# Patient Record
Sex: Female | Born: 1995 | Race: White | Hispanic: No | Marital: Single
Health system: Midwestern US, Community
[De-identification: ages and names within clinical notes are randomized; demographics above are authoritative.]

## PROBLEM LIST (undated history)

## (undated) DIAGNOSIS — L709 Acne, unspecified: Secondary | ICD-10-CM

## (undated) HISTORY — PX: WISDOM TOOTH EXTRACTION: SHX21

## (undated) HISTORY — DX: Acne, unspecified: L70.9

---

## 2014-07-28 ENCOUNTER — Emergency Department: Payer: Self-pay | Admitting: Emergency Medicine

## 2014-07-28 LAB — BASIC METABOLIC PANEL
Anion Gap: 5 — ABNORMAL LOW (ref 7–16)
BUN: 8 mg/dL — ABNORMAL LOW (ref 9–21)
CHLORIDE: 109 mmol/L — AB (ref 97–107)
Calcium, Total: 8 mg/dL — ABNORMAL LOW (ref 9.0–10.7)
Co2: 27 mmol/L — ABNORMAL HIGH (ref 16–25)
Creatinine: 0.79 mg/dL (ref 0.60–1.30)
EGFR (African American): >60
EGFR (Non-African Amer.): >60
Glucose: 95 mg/dL (ref 65–99)
Osmolality: 279 (ref 275–301)
Potassium: 3.6 mmol/L (ref 3.3–4.7)
Sodium: 141 mmol/L (ref 132–141)

## 2014-07-28 LAB — CBC
HCT: 38.4 % (ref 35.0–47.0)
HGB: 12.7 g/dL (ref 12.0–16.0)
MCH: 30.3 pg (ref 26.0–34.0)
MCHC: 32.9 g/dL (ref 32.0–36.0)
MCV: 92 fL (ref 80–100)
Platelet: 272 10*3/uL (ref 150–440)
RBC: 4.17 10*6/uL (ref 3.80–5.20)
RDW: 13.4 % (ref 11.5–14.5)
WBC: 11.2 10*3/uL — ABNORMAL HIGH (ref 3.6–11.0)

## 2014-07-28 LAB — ETHANOL: Ethanol: 163 mg/dL

## 2015-04-14 IMAGING — CT CT MAXILLOFACIAL WITHOUT CONTRAST
4 of 9 series · 16 of 47 positions shown, 18 images · non-contrast
Comparison: None.

CLINICAL DATA: Fall with head injury.

EXAM:
CT HEAD WITHOUT CONTRAST
CT MAXILLOFACIAL WITHOUT CONTRAST
CT CERVICAL SPINE WITHOUT CONTRAST
TECHNIQUE: Multidetector CT imaging of the head, cervical spine, and
maxillofacial structures were performed using the standard protocol
without intravenous contrast. Multiplanar CT image reconstructions
of the cervical spine and maxillofacial structures were also
generated.

[Series 3: head bone · axial · 0.40mm/px · z∈[-7,+95]mm · 6 of 96 slices shown, 8 images]
[im 14/96  brain]
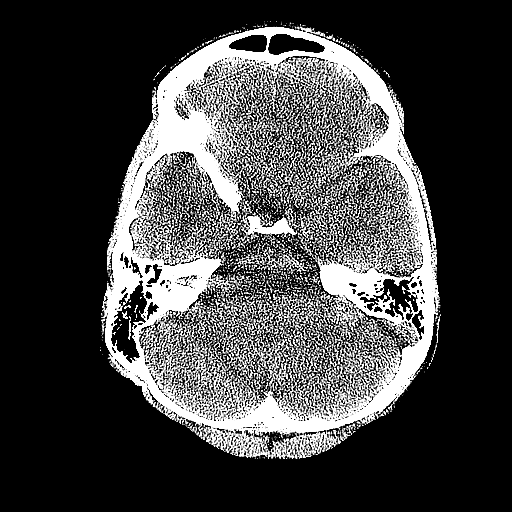
[im 14/96  bone]
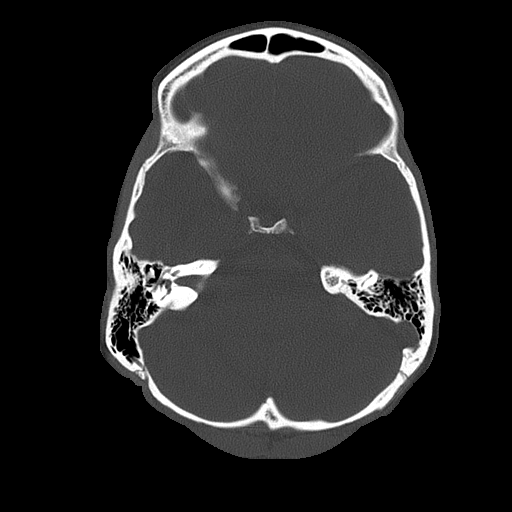
[im 28/96  bone]
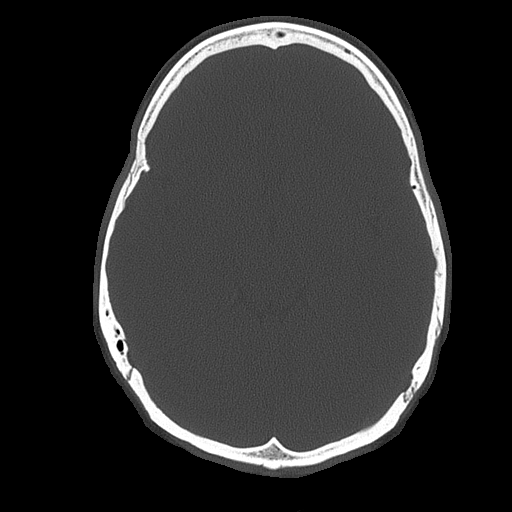
[im 41/96  bone]
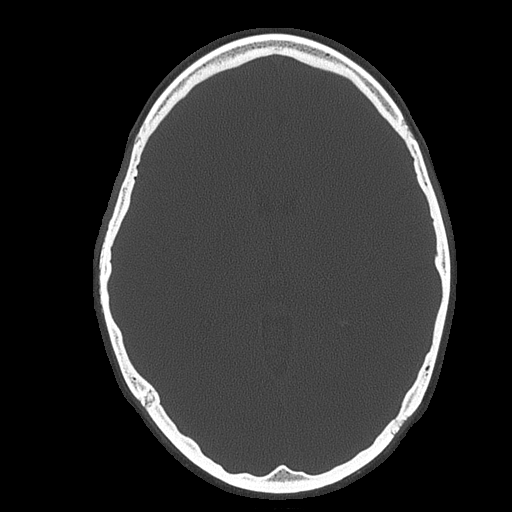
[im 55/96  bone]
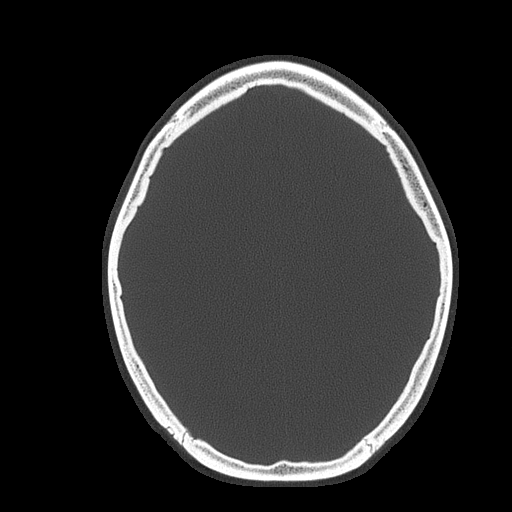
[im 68/96  brain]
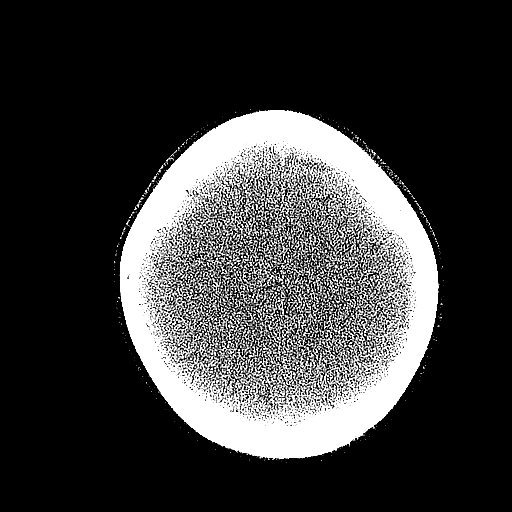
[im 68/96  bone]
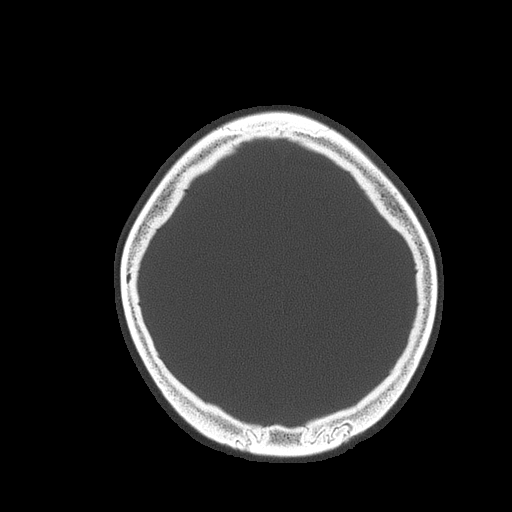
[im 82/96  bone]
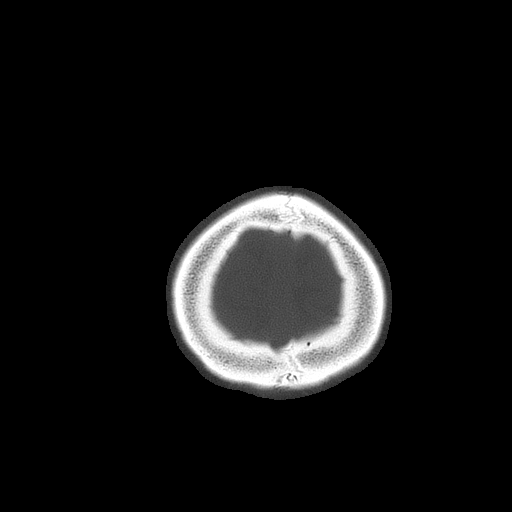

[Series 11: sagittal soft · sagittal · 0.33mm/px · 3 of 82 slices shown]
[im 21/82  bone]
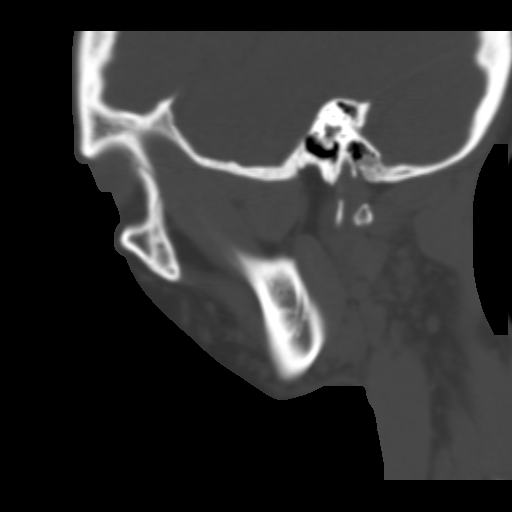
[im 41/82  bone]
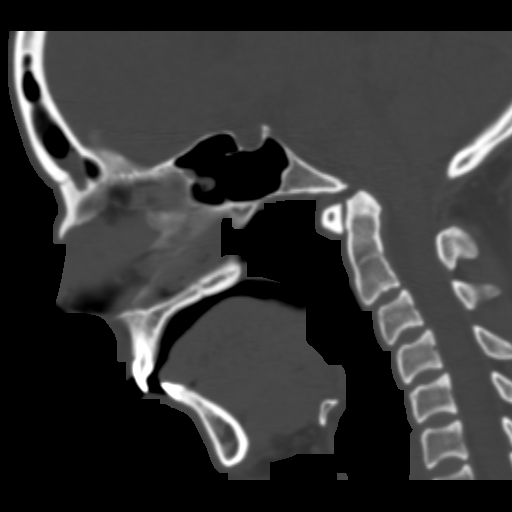
[im 61/82  bone]
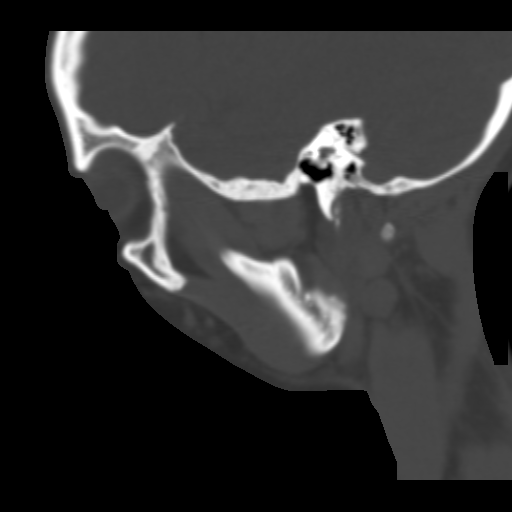

[Series 15: cor bone · coronal · 0.30mm/px · 1 of 33 slices shown]
[im 17/33  bone]
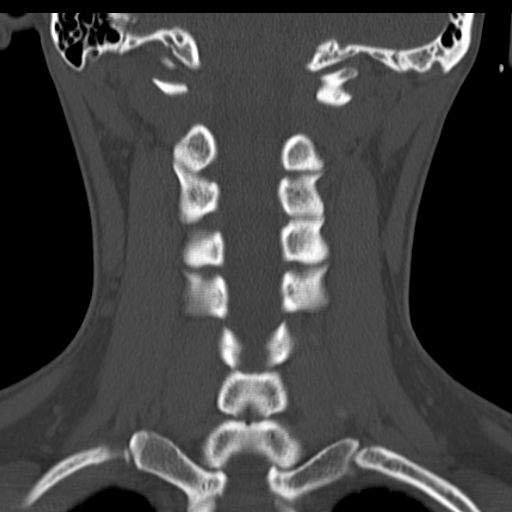

[Series 16: orthogonal axials · axial · 0.29mm/px · z∈[-185,-57]mm · 6 of 97 slices shown]
[im 14/97  bone]
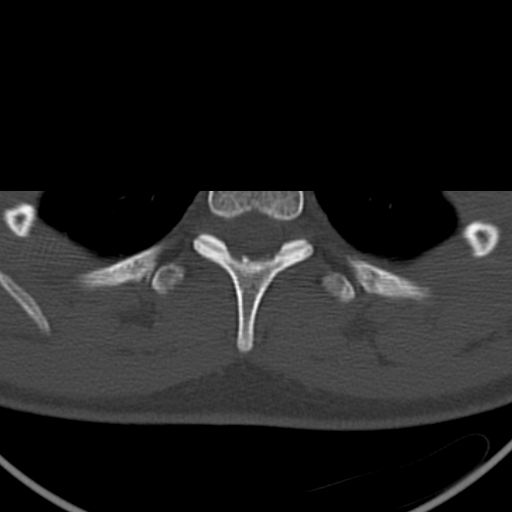
[im 28/97  bone]
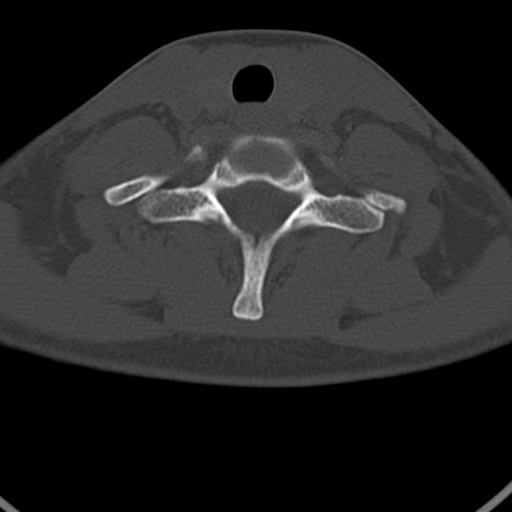
[im 42/97  bone]
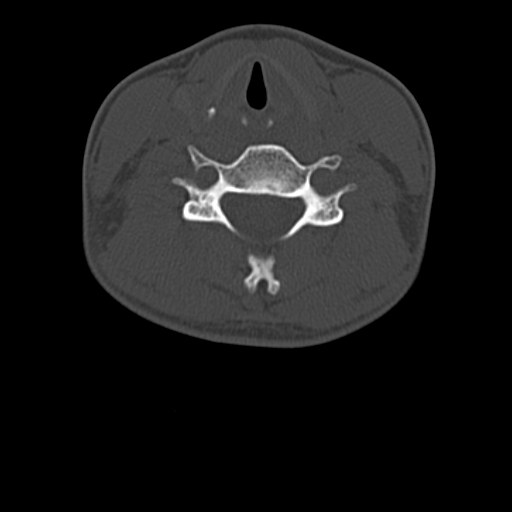
[im 55/97  bone]
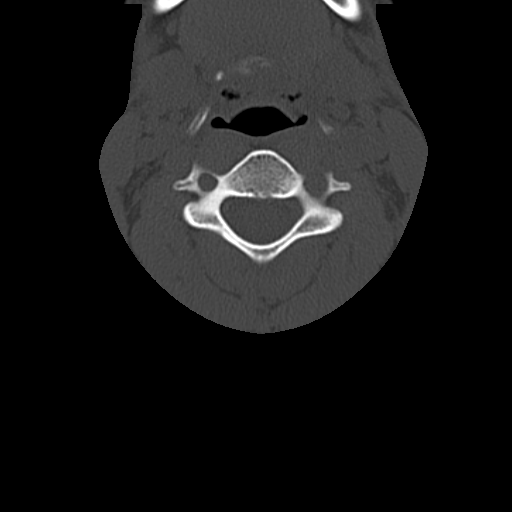
[im 69/97  bone]
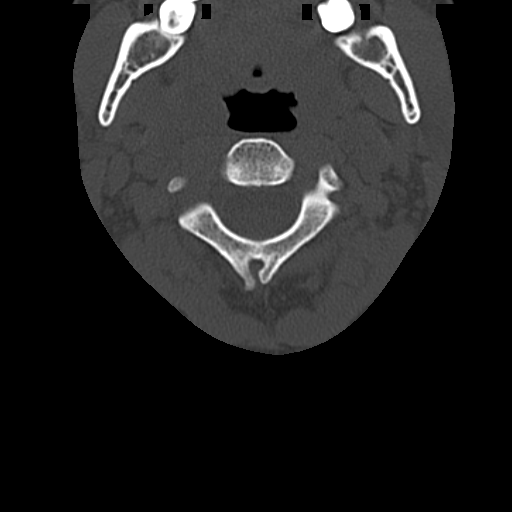
[im 83/97  bone]
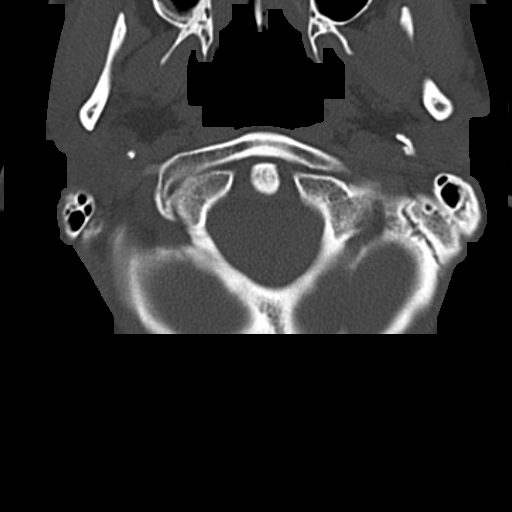

[16 of 47 positions shown; findings below may reference images not displayed]

FINDINGS: CT HEAD FINDINGS

Skull:Negative for calvarial fracture.

Orbits: No acute abnormality.

Brain: No evidence of acute abnormality, such as acute infarction,
hemorrhage, hydrocephalus, or mass lesion/mass effect.

CT MAXILLOFACIAL FINDINGS

There is a fracture of the right upper central incisor. No
neighboring alveolus fracture. No mandible fracture. No facial bone
fracture. Nonspecific dysconjugate gaze. No evidence of globe injury
or postseptal hematoma. There is scattered inflammatory mucosal
thickening, with secretion retention in the frontal sinus. This is
considered incidental based on the clinical history. An apparent
high density area in the left frontal region is considered streak
artifact when correlated with reformats and contemporaneous head CT.
Probable mild forehead contusion.

CT CERVICAL SPINE FINDINGS

Hypoplastic occipital condyles with dorsal tilting of the dens and
posterior C1 ring incomplete ossification. These findings are
incidental.

Negative for acute fracture or subluxation. No prevertebral edema.
No gross cervical canal hematoma. No significant osseous canal or
foraminal stenosis.
IMPRESSION: 1. No evidence of acute intracranial or cervical spine injury.
2. Right upper central incisor fracture.

## 2016-07-03 ENCOUNTER — Ambulatory Visit (INDEPENDENT_AMBULATORY_CARE_PROVIDER_SITE_OTHER): Payer: 59 | Admitting: Podiatry

## 2016-07-03 ENCOUNTER — Encounter: Payer: Self-pay | Admitting: Podiatry

## 2016-07-03 DIAGNOSIS — B070 Plantar wart: Secondary | ICD-10-CM | POA: Diagnosis not present

## 2016-07-03 DIAGNOSIS — B079 Viral wart, unspecified: Secondary | ICD-10-CM

## 2016-07-03 DIAGNOSIS — B078 Other viral warts: Secondary | ICD-10-CM

## 2016-07-03 NOTE — Patient Instructions (Signed)
Take dressing off in 8 hours and wash the foot with soap and water. If it is hurting or becomes uncomfortable before the 8 hours, go ahead and remove the bandage and wash the area.  If it blisters, apply antibiotic ointment and a band-aid.  Monitor for any signs/symptoms of infection. Call the office immediately if any occur or go directly to the emergency room. Call with any questions/concerns.   

## 2016-07-03 NOTE — Progress Notes (Signed)
   Subjective:    Patient ID: Alexandria Johnston, female    DOB: [birth year], 20 y.o.   MRN: [id number]  HPI  20 year old female presents the office they for concerns of warts to the right foot which is been ongoing for several months. She states that some of them to become painful with weightbearing or pressure. She's had no treatment for this. She denies any redness or swelling or any drainage. She denies tobacco any foreign objects. Other complaints at this time.  Review of Systems  All other systems reviewed and are negative.      Objective:   Physical Exam General: AAO x3, NAD  Dermatological: There are 7 small annular hyperkeratotic lesions. Upon edema there is pinpoint bleeding and there is evidence of verruca. These are located submetatarsal 32, plantar fourth interspace, right second toe, right fourth toe 2, submetatarsal one  Vascular: Dorsalis Pedis artery and Posterior Tibial artery pedal pulses are 2/4 bilateral with immedate capillary fill time. Pedal hair growth present. No varicosities and no lower extremity edema present bilateral. There is no pain with calf compression, swelling, warmth, erythema.   Neruologic: Grossly intact via light touch bilateral. Vibratory intact via tuning fork bilateral. Protective threshold with Semmes Wienstein monofilament intact to all pedal sites bilateral.   Musculoskeletal: No gross boney pedal deformities bilateral. No pain, crepitus, or limitation noted with foot and ankle range of motion bilateral. Muscular strength 5/5 in all groups tested bilateral.  Gait: Unassisted, Nonantalgic.     Assessment & Plan:  20 year old female right foot verruca -Treatment options discussed including all alternatives, risks, and complications -Etiology of symptoms were discussed -Lesions were all debrided and today. Hemostasis was achieved and the areas were cleaned. The lesions on the plantar aspect of the foot were cleaned in Cantharone was applied  followed by an occlusive bandage. This included 4 lesions. -Order verruca compound cream  -This procedure tractions were discussed. Monitor for infection.  -Follow-up as scheduled or sooner if needed. Call with any question or concerns in the meantime.   Ovid CurdMatthew Wagoner, DPM

## 2016-07-07 DIAGNOSIS — B078 Other viral warts: Secondary | ICD-10-CM | POA: Insufficient documentation

## 2016-07-07 DIAGNOSIS — B079 Viral wart, unspecified: Secondary | ICD-10-CM | POA: Insufficient documentation

## 2016-07-24 ENCOUNTER — Ambulatory Visit (INDEPENDENT_AMBULATORY_CARE_PROVIDER_SITE_OTHER): Payer: 59 | Admitting: Podiatry

## 2016-07-24 ENCOUNTER — Encounter: Payer: Self-pay | Admitting: Podiatry

## 2016-07-24 DIAGNOSIS — B078 Other viral warts: Secondary | ICD-10-CM

## 2016-07-24 DIAGNOSIS — B070 Plantar wart: Secondary | ICD-10-CM | POA: Diagnosis not present

## 2016-07-24 DIAGNOSIS — B079 Viral wart, unspecified: Secondary | ICD-10-CM

## 2016-07-24 NOTE — Patient Instructions (Signed)
Take dressing off in 8 hours and wash the foot with soap and water. If it is hurting or becomes uncomfortable before the 8 hours, go ahead and remove the bandage and wash the area.  If it blisters, apply antibiotic ointment and a band-aid.  Monitor for any signs/symptoms of infection. Call the office immediately if any occur or go directly to the emergency room. Call with any questions/concerns.   

## 2016-07-28 NOTE — Progress Notes (Signed)
Subjective: 20 year old female presents the also for follow-up evaluation of warts to the right foot. She states that they have gotten better but they are still there. She denies any pain. Denies any complications of medication last appointment. Denies any swelling or possibly redness to her knee. Denies any systemic complaints such as fevers, chills, nausea, vomiting. No acute changes since last appointment, and no other complaints at this time.   Objective: AAO x3, NAD DP/PT pulses palpable bilaterally, CRT less than 3 seconds Very verruca to the right foot appear to be improving to the submetatarsal 32, plantar fourth interspace. Upon debridement there almost resolved. Lesions to continue to the right second toe, right fourth toe 2. There is evidence of pinpoint bleeding and verruca. No other lesions are identified today. No edema, erythema, increase in warmth to bilateral lower extremities.  No open lesions or pre-ulcerative lesions.  No pain with calf compression, swelling, warmth, erythema  Assessment: Verruca, improving right foot  Plan: -All treatment options discussed with the patient including all alternatives, risks, complications.  -Lesions were all debrided today without, occasions. Hemostasis was achieved and the areas were cleaned. Small amount of canthrone was applied to the lesions 7. This procedure tractions were discussed. Monitor for infection. -We are compound cream for verruca she now do this after last appointment. -Follow-up in 3 weeks or sooner. -Patient encouraged to call the office with any questions, concerns, change in symptoms.   Ovid CurdMatthew Wagoner, DPM

## 2016-07-29 ENCOUNTER — Telehealth: Payer: Self-pay | Admitting: *Deleted

## 2016-07-29 NOTE — Telephone Encounter (Signed)
OK we will hold off for now until I see her again. Thanks.

## 2016-07-29 NOTE — Telephone Encounter (Addendum)
Pt states the verruca compound cream is too expensive. Dr. Ardelle AntonWagoner states hold off on the wart compound or OTC product until evaluated on next visit.  Informed pt and she states understanding.

## 2016-08-14 ENCOUNTER — Encounter: Payer: Self-pay | Admitting: Podiatry

## 2016-08-14 ENCOUNTER — Ambulatory Visit (INDEPENDENT_AMBULATORY_CARE_PROVIDER_SITE_OTHER): Payer: 59 | Admitting: Podiatry

## 2016-08-14 DIAGNOSIS — B070 Plantar wart: Secondary | ICD-10-CM

## 2016-08-14 NOTE — Patient Instructions (Signed)
Take dressing off in 8 hours and wash the foot with soap and water. If it is hurting or becomes uncomfortable before the 8 hours, go ahead and remove the bandage and wash the area.  If it blisters, apply antibiotic ointment and a band-aid.  Monitor for any signs/symptoms of infection. Call the office immediately if any occur or go directly to the emergency room. Call with any questions/concerns.   

## 2016-08-15 DIAGNOSIS — B070 Plantar wart: Secondary | ICD-10-CM | POA: Insufficient documentation

## 2016-08-15 NOTE — Progress Notes (Addendum)
Subjective: 20 year old female presents the also for follow-up evaluation of warts to the right foot. She is doing well and she feels the warts are getting better. No complications after the last treatment. No pain to the lesions. No redness or swelling. Denies any systemic complaints such as fevers, chills, nausea, vomiting. No acute changes since last appointment, and no other complaints at this time.   Objective: AAO x3, NAD DP/PT pulses palpable bilaterally, CRT less than 3 seconds Verruca continue on the right plantar 2nd and 4th toe and submetatarsal x 2 but these lesions appear to be improved and smaller. The other lesions appear to be resolved. No tenderness to palpation, drainage, redness, swelling. No other lesions identified.  No edema, erythema, increase in warmth to bilateral lower extremities.  No open lesions or pre-ulcerative lesions.  No pain with calf compression, swelling, warmth, erythema  Assessment: Verruca, improving right foot  Plan: -All treatment options discussed with the patient including all alternatives, risks, complications.  -Lesions were all debrided today without, occasions. Hemostasis was achieved and the areas were cleaned. Small amount of canthrone was applied to the lesions 4. Post-procedure tractions were discussed. Monitor for infection. -Follow-up in 3 weeks if lesions continue or sooner if needed.  -Patient encouraged to call the office with any questions, concerns, change in symptoms.   Ovid CurdMatthew Wagoner, DPM

## 2016-09-11 ENCOUNTER — Ambulatory Visit: Payer: 59 | Admitting: Podiatry

## 2017-07-28 ENCOUNTER — Encounter: Payer: Self-pay | Admitting: Advanced Practice Midwife

## 2017-07-28 ENCOUNTER — Ambulatory Visit (INDEPENDENT_AMBULATORY_CARE_PROVIDER_SITE_OTHER): Payer: 59 | Admitting: Advanced Practice Midwife

## 2017-07-28 VITALS — BP 114/78 | Ht 64.0 in | Wt 131.0 lb

## 2017-07-28 DIAGNOSIS — Z01419 Encounter for gynecological examination (general) (routine) without abnormal findings: Secondary | ICD-10-CM | POA: Diagnosis not present

## 2017-07-28 DIAGNOSIS — Z124 Encounter for screening for malignant neoplasm of cervix: Secondary | ICD-10-CM | POA: Diagnosis not present

## 2017-07-28 DIAGNOSIS — Z3041 Encounter for surveillance of contraceptive pills: Secondary | ICD-10-CM | POA: Diagnosis not present

## 2017-07-28 MED ORDER — DROSPIRENONE-ETHINYL ESTRADIOL 3-0.02 MG PO TABS: 1.0000 | ORAL_TABLET | Freq: Every day | ORAL | 11 refills | Status: AC

## 2017-07-28 NOTE — Progress Notes (Signed)
Patient ID: Alexandria Johnston, female   DOB: 1996/03/28, 21 y.o.   MRN: [id number]     Gynecology Annual Exam  PCP: Patient, No Pcp Per  Chief Complaint:  Chief Complaint  Patient presents with  . Annual Exam    History of Present Illness: Patient is a 21 y.o. G0P0000 presents for annual exam. The patient has no complaints today. She requests a refill of her birth control.  LMP: Patient's last menstrual period was 07/24/2017. Menarche:not applicable Average Interval: regular, 28 days Duration of flow: 4 days Heavy Menses: no Clots: no Intermenstrual Bleeding: no Postcoital Bleeding: no Dysmenorrhea: no  The patient is not currently sexually active. She currently uses OCP (estrogen/progesterone) for contraception. She denies dyspareunia.  The patient does not perform self breast exams.  There is no notable family history of breast or ovarian cancer in her family.  The patient wears seatbelts: yes.  The patient has regular exercise: yes.    The patient denies current symptoms of depression.    Review of Systems: Review of Systems  Constitutional: Negative.   HENT: Negative.   Eyes: Negative.   Respiratory: Negative.   Cardiovascular: Negative.   Gastrointestinal: Negative.   Genitourinary: Negative.   Musculoskeletal: Negative.   Skin: Negative.   Neurological: Negative.   Endo/Heme/Allergies: Negative.   Psychiatric/Behavioral: Negative.     Past Medical History:  Past Medical History:  Diagnosis Date  . Acne HSV 1 genital         Past Surgical History:  Past Surgical History:  Procedure Laterality Date  . WISDOM TOOTH EXTRACTION      Gynecologic History:  Patient's last menstrual period was 07/24/2017. Contraception: OCP (estrogen/progesterone) Last Pap: has never had a PAP   Obstetric History: G0P0000  Family History:  History reviewed. No pertinent family history.  Social History:  Social History   Social History  . Marital status: Unknown    Spouse name: N/A  . Number of children: N/A  . Years of education: N/A   Occupational History  . Not on file.   Social History Main Topics  . Smoking status: Never Smoker  . Smokeless tobacco: Never Used  . Alcohol use Yes  . Drug use: Unknown  . Sexual activity: Not Currently    Birth control/ protection: Pill   Other Topics Concern  . Not on file   Social History Narrative  . No narrative on file    Allergies:  Allergies  Allergen Reactions  . Amoxicillin Rash    Medications: Prior to Admission medications   Medication Sig Start Date End Date Taking? Authorizing Provider  fluticasone (FLONASE) 50 MCG/ACT nasal spray  10/19/15  Yes [provider]  drospirenone-ethinyl estradiol (YAZ,GIANVI,LORYNA) 3-0.02 MG tablet Take 1 tablet by mouth daily. 07/28/17   Tresea Mall, CNM    Physical Exam Vitals: Blood pressure 114/78, height  (1.626 m), weight 131 lb (59.4 kg), last menstrual period 07/24/2017.  General: NAD HEENT: normocephalic, anicteric Thyroid: no enlargement, no palpable nodules Pulmonary: No increased work of breathing, CTAB Cardiovascular: RRR, distal pulses 2+ Breast: Breast symmetrical, no tenderness, no palpable nodules or masses, no skin or nipple retraction present, no nipple discharge.  No axillary or supraclavicular lymphadenopathy. Abdomen: NABS, soft, non-tender, non-distended.  Umbilicus without lesions.  No hepatomegaly, splenomegaly or masses palpable. No evidence of hernia  Genitourinary:  External: Normal external female genitalia.  Normal urethral meatus, normal  Bartholin's and Skene's glands.    Vagina: Normal vaginal mucosa, no evidence of  prolapse.    Cervix: Grossly normal in appearance, no bleeding, no CMT  Uterus: Non-enlarged, mobile, normal contour.   Adnexa: ovaries non-enlarged, no adnexal masses  Rectal: deferred  Lymphatic: no evidence of inguinal lymphadenopathy Extremities: no edema, erythema, or  tenderness Neurologic: Grossly intact Psychiatric: mood appropriate, affect full   Assessment: 21 y.o. G0P0000 routine annual exam  Plan: Problem List Items Addressed This Visit    None    Visit Diagnoses    Well woman exam with routine gynecological exam    -  Primary   Relevant Orders   IGP, rfx Aptima HPV ASCU   Cervical cancer screening       Relevant Orders   IGP, rfx Aptima HPV ASCU   Encounter for surveillance of contraceptive pills       Relevant Medications   drospirenone-ethinyl estradiol (YAZ,GIANVI,LORYNA) 3-0.02 MG tablet      1) 4) Gardasil Series discussed and if applicable offered to patient - Patient has previously completed 3 shot series   2) STI screening was offered and declined   3) ASCCP guidelines and rational discussed.  Patient opts for yearly screening interval  4) Contraception - patient prefers to continue with current OCP   5) Follow up 1 year for routine annual exam  Tresea Mall, CNM

## 2017-07-30 LAB — IGP, RFX APTIMA HPV ASCU: PAP SMEAR COMMENT: 0

## 2017-08-05 ENCOUNTER — Telehealth: Payer: Self-pay

## 2017-08-05 NOTE — Telephone Encounter (Signed)
Pt inquiring if test results from last week are back yet.

## 2018-05-25 ENCOUNTER — Inpatient Hospital Stay
Admit: 2018-05-25 | Discharge: 2018-05-25 | Disposition: A | Discharge status: Home / Self Care | Payer: PRIVATE HEALTH INSURANCE | Attending: Emergency Medicine

## 2018-05-25 ENCOUNTER — Emergency Department: Admit: 2018-05-25 | Payer: PRIVATE HEALTH INSURANCE

## 2018-05-25 DIAGNOSIS — E860 Dehydration: Secondary | ICD-10-CM

## 2018-05-25 LAB — URINALYSIS WITH MICROSCOPIC
BACTERIA, URINE: NEGATIVE /hpf
Bilirubin, Urine: NEGATIVE
Glucose, Ur: NEGATIVE mg/dL
Nitrite, Urine: NEGATIVE
Protein, UA: NEGATIVE mg/dL
Specific Gravity, UA: 1.029 (ref 1.003–1.030)
Urobilinogen, UA, POCT: 0.2 EU/dL (ref 0.2–1.0)
pH, UA: 5.5 (ref 5.0–8.0)

## 2018-05-25 LAB — CBC WITH AUTO DIFFERENTIAL
Basophils %: 0 % (ref 0–1)
Basophils Absolute: 0.1 10*3/uL (ref 0.0–0.1)
Eosinophils %: 0 % (ref 0–7)
Eosinophils Absolute: 0.1 10*3/uL (ref 0.0–0.4)
Granulocyte Absolute Count: 0.1 10*3/uL — ABNORMAL HIGH (ref 0.00–0.04)
Hematocrit: 41.2 % (ref 35.0–47.0)
Hemoglobin: 13.6 g/dL (ref 11.5–16.0)
Immature Granulocytes: 0 % (ref 0.0–0.5)
Lymphocytes %: 6 % — ABNORMAL LOW (ref 12–49)
Lymphocytes Absolute: 1.2 10*3/uL (ref 0.8–3.5)
MCH: 30 PG (ref 26.0–34.0)
MCHC: 33 g/dL (ref 30.0–36.5)
MCV: 90.9 FL (ref 80.0–99.0)
MPV: 9.7 FL (ref 8.9–12.9)
Monocytes %: 6 % (ref 5–13)
Monocytes Absolute: 1.2 10*3/uL — ABNORMAL HIGH (ref 0.0–1.0)
NRBC Absolute: 0 10*3/uL (ref 0.00–0.01)
Neutrophils %: 88 % — ABNORMAL HIGH (ref 32–75)
Neutrophils Absolute: 16.7 10*3/uL — ABNORMAL HIGH (ref 1.8–8.0)
Nucleated RBCs: 0 PER 100 WBC
Platelets: 260 10*3/uL (ref 150–400)
RBC: 4.53 M/uL (ref 3.80–5.20)
RDW: 12.6 % (ref 11.5–14.5)
WBC: 19.3 10*3/uL — ABNORMAL HIGH (ref 3.6–11.0)

## 2018-05-25 LAB — COMPREHENSIVE METABOLIC PANEL
ALT: 17 U/L (ref 12–78)
AST: 26 U/L (ref 15–37)
Albumin/Globulin Ratio: 1 — ABNORMAL LOW (ref 1.1–2.2)
Albumin: 3.9 g/dL (ref 3.5–5.0)
Alkaline Phosphatase: 60 U/L (ref 45–117)
Anion Gap: 6 mmol/L (ref 5–15)
BUN: 11 MG/DL (ref 6–20)
Bun/Cre Ratio: 11 — ABNORMAL LOW (ref 12–20)
CO2: 24 mmol/L (ref 21–32)
Calcium: 9.3 MG/DL (ref 8.5–10.1)
Chloride: 106 mmol/L (ref 97–108)
Creatinine: 1.04 MG/DL — ABNORMAL HIGH (ref 0.55–1.02)
EGFR IF NonAfrican American: >60 mL/min/{1.73_m2} (ref >60)
GFR African American: >60 mL/min/{1.73_m2} (ref >60)
Globulin: 3.9 g/dL (ref 2.0–4.0)
Glucose: 92 mg/dL (ref 65–100)
Potassium: 4.8 mmol/L (ref 3.5–5.1)
Sodium: 136 mmol/L (ref 136–145)
Total Bilirubin: 1.1 MG/DL — ABNORMAL HIGH (ref 0.2–1.0)
Total Protein: 7.8 g/dL (ref 6.4–8.2)

## 2018-05-25 LAB — LIPASE
Lipase: 71 U/L — ABNORMAL LOW (ref 73–393)
Lipase: 71 U/L — ABNORMAL LOW (ref 73–393)

## 2018-05-25 LAB — HCG URINE, QL. - POC
HCG, Pregnancy, Urine, POC: NEGATIVE
Pregnancy test,urine (POC): NEGATIVE

## 2018-05-25 LAB — CBC WITH AUTOMATED DIFF
ABS. BASOPHILS: 0.1 10*3/uL (ref 0.0–0.1)
ABS. EOSINOPHILS: 0.1 10*3/uL (ref 0.0–0.4)
ABS. IMM. GRANS.: 0.1 10*3/uL — ABNORMAL HIGH (ref 0.00–0.04)
ABS. LYMPHOCYTES: 1.2 10*3/uL (ref 0.8–3.5)
ABS. MONOCYTES: 1.2 10*3/uL — ABNORMAL HIGH (ref 0.0–1.0)
ABS. NEUTROPHILS: 16.7 10*3/uL — ABNORMAL HIGH (ref 1.8–8.0)
ABSOLUTE NRBC: 0 10*3/uL (ref 0.00–0.01)
BASOPHILS: 0 % (ref 0–1)
EOSINOPHILS: 0 % (ref 0–7)
HCT: 41.2 % (ref 35.0–47.0)
HGB: 13.6 g/dL (ref 11.5–16.0)
IMMATURE GRANULOCYTES: 0 % (ref 0.0–0.5)
LYMPHOCYTES: 6 % — ABNORMAL LOW (ref 12–49)
MCH: 30 PG (ref 26.0–34.0)
MCHC: 33 g/dL (ref 30.0–36.5)
MCV: 90.9 FL (ref 80.0–99.0)
MONOCYTES: 6 % (ref 5–13)
MPV: 9.7 FL (ref 8.9–12.9)
NEUTROPHILS: 88 % — ABNORMAL HIGH (ref 32–75)
NRBC: 0 PER 100 WBC
PLATELET: 260 10*3/uL (ref 150–400)
RBC: 4.53 M/uL (ref 3.80–5.20)
RDW: 12.6 % (ref 11.5–14.5)
WBC: 19.3 10*3/uL — ABNORMAL HIGH (ref 3.6–11.0)

## 2018-05-25 LAB — URINALYSIS W/MICROSCOPIC
Bacteria: NEGATIVE /hpf
Bilirubin: NEGATIVE
Glucose: NEGATIVE mg/dL
Nitrites: NEGATIVE
Protein: NEGATIVE mg/dL
Specific gravity: 1.029 (ref 1.003–1.030)
Urobilinogen: 0.2 EU/dL (ref 0.2–1.0)
pH (UA): 5.5 (ref 5.0–8.0)

## 2018-05-25 LAB — METABOLIC PANEL, COMPREHENSIVE
A-G Ratio: 1 — ABNORMAL LOW (ref 1.1–2.2)
ALT (SGPT): 17 U/L (ref 12–78)
AST (SGOT): 26 U/L (ref 15–37)
Albumin: 3.9 g/dL (ref 3.5–5.0)
Alk. phosphatase: 60 U/L (ref 45–117)
Anion gap: 6 mmol/L (ref 5–15)
BUN/Creatinine ratio: 11 — ABNORMAL LOW (ref 12–20)
BUN: 11 MG/DL (ref 6–20)
Bilirubin, total: 1.1 MG/DL — ABNORMAL HIGH (ref 0.2–1.0)
CO2: 24 mmol/L (ref 21–32)
Calcium: 9.3 MG/DL (ref 8.5–10.1)
Chloride: 106 mmol/L (ref 97–108)
Creatinine: 1.04 MG/DL — ABNORMAL HIGH (ref 0.55–1.02)
GFR est AA: >60 mL/min/{1.73_m2} (ref >60)
GFR est non-AA: >60 mL/min/{1.73_m2} (ref >60)
Globulin: 3.9 g/dL (ref 2.0–4.0)
Glucose: 92 mg/dL (ref 65–100)
Potassium: 4.8 mmol/L (ref 3.5–5.1)
Protein, total: 7.8 g/dL (ref 6.4–8.2)
Sodium: 136 mmol/L (ref 136–145)

## 2018-05-25 LAB — URINE CULTURE HOLD SAMPLE

## 2018-05-25 LAB — SAMPLES BEING HELD: SAMPLES BEING HELD: 1

## 2018-05-25 MED ORDER — SODIUM CHLORIDE 0.9% BOLUS IV
0.9 % | Freq: Once | INTRAVENOUS | Status: AC
Start: 2018-05-25 — End: 2018-05-25
  Administered 2018-05-25: 14:00:00 via INTRAVENOUS

## 2018-05-25 MED ORDER — SODIUM CHLORIDE 0.9 % IJ SYRG
Freq: Once | INTRAMUSCULAR | Status: AC
Start: 2018-05-25 — End: 2018-05-25
  Administered 2018-05-25: 15:00:00 via INTRAVENOUS

## 2018-05-25 MED ORDER — IOPAMIDOL 76 % IV SOLN
370 mg iodine /mL (76 %) | Freq: Once | INTRAVENOUS | Status: AC
Start: 2018-05-25 — End: 2018-05-25
  Administered 2018-05-25: 15:00:00 via INTRAVENOUS

## 2018-05-25 MED ORDER — SODIUM CHLORIDE 0.9% BOLUS IV
0.9 % | Freq: Once | INTRAVENOUS | Status: AC
Start: 2018-05-25 — End: 2018-05-25
  Administered 2018-05-25: 15:00:00 via INTRAVENOUS

## 2018-05-25 MED FILL — NORMAL SALINE FLUSH 0.9 % INJECTION SYRINGE: INTRAMUSCULAR | Qty: 10

## 2018-05-25 MED FILL — SODIUM CHLORIDE 0.9 % IV: INTRAVENOUS | Qty: 100

## 2018-05-25 MED FILL — ISOVUE-370  76 % INTRAVENOUS SOLUTION: 370 mg iodine /mL (76 %) | INTRAVENOUS | Qty: 100

## 2018-05-25 MED FILL — SODIUM CHLORIDE 0.9 % IV: INTRAVENOUS | Qty: 1000

## 2018-05-25 NOTE — ED Notes (Signed)
Pt states that she is having abd pain but does not want anything to eat.

## 2018-05-25 NOTE — ED Provider Notes (Signed)
ED Provider Notes by Marvia Pickles, NP at 05/25/18 0820                Author: Marvia Pickles, NP  Service: EMERGENCY  Author Type: Nurse Practitioner       Filed: 05/25/18 1215  Date of Service: 05/25/18 0820  Status: Attested           Editor: Marvia Pickles, NP (Nurse Practitioner)  Cosigner: Lovey Newcomer, MD at 05/31/18 [phone]          Attestation signed by Lovey Newcomer, MD at 05/31/18 [phone]          I was personally available for consultation in the emergency department.  I have reviewed the chart and agree with the documentation recorded by the [facility], including  the assessment, treatment plan, and disposition.   Lovey Newcomer, MD                                    Initial Complaint: LLQ and flank pain      Started: 4:45 this morning      Endorses: LLQ pain with left flank pain. Sharp pain. 6-7/10. Never had before.    Denies: F/C, N/V, D/C, CP, SOB      Made better: rest   Made worse: walking and stand      No further complaints.       No past medical history on file.   No past surgical history on file.   Reviewed      Primary care provider: No primary care provider on file.      The history is provided by the patient. No language interpreter  was used.        No past medical history on file.      No past surgical history on file.        No family history on file.        Social History          Socioeconomic History         ?  Marital status:  Not on file              Spouse name:  Not on file         ?  Number of children:  Not on file     ?  Years of education:  Not on file     ?  Highest education level:  Not on file       Occupational History        ?  Not on file       Social Needs         ?  Financial resource strain:  Not on file        ?  Food insecurity:              Worry:  Not on file         Inability:  Not on file        ?  Transportation needs:              Medical:  Not on file         Non-medical:  Not on file       Tobacco Use         ?  Smoking status:  Not on file        Substance  and Sexual Activity         ?  Alcohol use:  Not on file     ?  Drug use:  Not on file     ?  Sexual activity:  Not on file       Lifestyle        ?  Physical activity:              Days per week:  Not on file         Minutes per session:  Not on file         ?  Stress:  Not on file       Relationships        ?  Social connections:              Talks on phone:  Not on file         Gets together:  Not on file         Attends religious service:  Not on file         Active member of club or organization:  Not on file         Attends meetings of clubs or organizations:  Not on file         Relationship status:  Not on file        ?  Intimate partner violence:              Fear of current or ex partner:  Not on file         Emotionally abused:  Not on file         Physically abused:  Not on file         Forced sexual activity:  Not on file        Other Topics  Concern        ?  Not on file       Social History Narrative        ?  Not on file        ALLERGIES: Patient has no allergy information on record.      Review of Systems    Constitutional: Negative for chills, diaphoresis and fever.    Respiratory: Negative for shortness of breath.     Cardiovascular: Negative for chest pain.    Gastrointestinal: Positive for abdominal pain. Negative for constipation, nausea and vomiting.    Genitourinary: Negative for dysuria.    Psychiatric/Behavioral: Negative.     All other systems reviewed and are negative.        Vitals:            05/25/18 0805  05/25/18 0823  05/25/18 0825          BP:      97/68     Pulse:  94    (!) 102     Resp:      16     Temp:      98.6 ??F (37 ??C)          SpO2:  100%  100%  100%             Physical Exam    Constitutional: She is oriented to person, place, and time. She appears well-developed and well-nourished.    HENT:    Head: Normocephalic and atraumatic.    Neck: Normal range of motion. Neck supple.    Cardiovascular: Normal rate, regular rhythm, normal heart sounds and intact distal  pulses.    Pulmonary/Chest: Effort normal and breath sounds normal. No respiratory distress. She has no wheezes. She has no rales. She exhibits no tenderness.    Abdominal: Soft. Normal appearance and bowel sounds are normal. There is tenderness in the  left lower quadrant. There is no rigidity, no rebound, no guarding and no CVA tenderness.         Musculoskeletal: Normal range of motion.   Neurological: She is alert and oriented  to person, place, and time.    Skin: Skin is warm and dry. No erythema.   Psychiatric: She has a normal mood and affect. Her behavior is normal. Judgment  and thought content normal.    Nursing note and vitals reviewed.          MDM          Procedures         Assessment & Plan:         Orders Placed This Encounter        ?  URINE CULTURE HOLD SAMPLE     ?  US ABD LTD     ?  URINALYSIS W/MICROSCOPIC     ?  CBC WITH AUTOMATED DIFF     ?  LIPASE     ?  METABOLIC PANEL, COMPREHENSIVE     ?  POC URINE PREGNANCY TEST     ?  montelukast (SINGULAIR) 10 mg tablet     ?  levocetirizine (XYZAL) 5 mg tablet     ?  fluticasone propionate (FLONASE) 50 mcg/actuation nasal spray     ?  drospirenone-ethinyl estradiol (YAZ) 3-0.02 mg tab        ?  tretinoin (RETIN-A) 0.01 % topical gel           Discussed with Lovey NewcomerPowell, Robert G, MD,ED Provider      Marvia Picklesenee D Javien Tesch, NP   05/25/18   8:29 AM      Elevated white count of 19.  Left lower quadrant ultrasound of the pelvis without findings.  Urine not concerning.  Will obtain contrasted CT of the abdomen.      Marvia Picklesenee D Ziza Hastings, NP   05/25/18   10:40 AM      Labs and imaging without acute findings. Follow-up with PCP. Follow-up with PCP in one week for repeat labs.  Discussed return precautions.       12:14 PM   Patient re-evaluated.  All questions answered.  Patient appropriate for discharge.  Given return precautions and follow up instructions.       LABORATORY TESTS:     Labs Reviewed       URINALYSIS W/MICROSCOPIC - Abnormal; Notable for the following components:             Result  Value            Appearance  CLOUDY (*)         Ketone  TRACE (*)         Blood  TRACE (*)         Leukocyte Esterase  SMALL (*)         Epithelial cells  MANY (*)            All other components within normal limits       CBC WITH AUTOMATED DIFF - Abnormal; Notable for the following components:            WBC  19.3 (*)         NEUTROPHILS  88 (*)         LYMPHOCYTES  6 (*)         ABS. NEUTROPHILS  16.7 (*)         ABS. MONOCYTES  1.2 (*)         ABS. IMM. GRANS.  0.1 (*)            All other components within normal limits       LIPASE - Abnormal; Notable for the following components:            Lipase  71 (*)            All other components within normal limits       METABOLIC PANEL, COMPREHENSIVE - Abnormal; Notable for the following components:            Creatinine  1.04 (*)         BUN/Creatinine ratio  11 (*)         Bilirubin, total  1.1 (*)         A-G Ratio  1.0 (*)            All other components within normal limits       URINE CULTURE HOLD SAMPLE     SAMPLES BEING HELD       HCG URINE, QL. - POC           IMAGING RESULTS:   Ct Abd Pelv W Cont      Result Date: 05/25/2018   INDICATION: Left lower quadrant abdominal pain. CT of the abdomen and pelvis is performed with 5 mm collimation. Study is performed with 100 cc of nonionic Isovue 370. Sagittal and coronal reformatted images were also performed. CT dose reduction was  achieved with the use of the standardized protocol tailored for this examination and automatic exposure control for dose modulation. Adaptive statistical iterative reconstruction (ASIR) was utilized. There is no prior study for direct comparison. Findings:  Lung bases: The visualized lung bases are clear. Liver: The liver is normal. Adrenals: Adrenal glands are normal. Pancreas: The pancreas is normal. Gallbladder: The gallbladder is normal. Kidneys: The kidneys are normal. Spleen: The spleen is normal.  Lymph nodes. There is no porta hepatitis, mesenteric,  retroperitoneal or pelvic lymphadenopathy. Bowel: No thickened or dilated loop of large or small bowel is visualized. Appendix: The appendix is normal. Urinary bladder: Urinary bladder is partially  filled and grossly normal. Miscellaneous: There is no free intraperitoneal gas. There is trace free fluid in the pelvis. There is no focal fluid collection to suggest abscess.       IMPRESSION: Trace free fluid in the pelvis.      US Transvaginal      Result Date: 05/25/2018   INDICATION: LLQ pain Exam: Transabdominal and transvaginal ultrasound of the pelvis. Transvaginal ultrasound is performed to better evaluate the adnexa and endometrium. Transabdominal: The uterus measures 5.9 cm x 2.7 cm x 3.0 cm. Echotexture of the uterus  is normal. Endometrial stripe is within normal limits for premenopausal female, measuring 2 mm. Ovaries are normal in size and echotexture. Right ovary measures 1.6 cm x 0.7 cm x 1.5 cm. Left ovary measures 2.6 cm x 2.6 cm x 1.1 cm. No adnexal mass is  visualized. There is no free fluid in the pelvis. Transvaginal: The uterus measures 6.1 cm x 2.4 cm x 3.9 cm. Echotexture of the uterus is normal. Endometrial stripe is within normal limits for premenopausal female, measuring 8 mm. Ovaries  are normal  in size and echotexture. Right ovary measures 1.4 cm x 3.1 cm x 1.4 cm. Left ovary measures 2.6 cm x 1.7 cm x 2.7 cm. There are multiple bilateral ovarian follicles. No adnexal mass is visualized. There is trace free fluid in the pelvis.       IMPRESSION: Trace free fluid in the pelvis.       [place] Pelv Non Obs      Result Date: 05/25/2018   INDICATION: LLQ pain Exam: Transabdominal and transvaginal ultrasound of the pelvis. Transvaginal ultrasound is performed to better evaluate the adnexa and endometrium. Transabdominal: The uterus measures 5.9 cm x 2.7 cm x 3.0 cm. Echotexture of the uterus  is normal. Endometrial stripe is within normal limits for premenopausal female, measuring 2 mm. Ovaries are  normal in size and echotexture. Right ovary measures 1.6 cm x 0.7 cm x 1.5 cm. Left ovary measures 2.6 cm x 2.6 cm x 1.1 cm. No adnexal mass is  visualized. There is no free fluid in the pelvis. Transvaginal: The uterus measures 6.1 cm x 2.4 cm x 3.9 cm. Echotexture of the uterus is normal. Endometrial stripe is within normal limits for premenopausal female, measuring 8 mm. Ovaries are normal  in size and echotexture. Right ovary measures 1.4 cm x 3.1 cm x 1.4 cm. Left ovary measures 2.6 cm x 1.7 cm x 2.7 cm. There are multiple bilateral ovarian follicles. No adnexal mass is visualized. There is trace free fluid in the pelvis.       IMPRESSION: Trace free fluid in the pelvis.          MEDICATIONS GIVEN:     Medications       sodium chloride 0.9 % bolus infusion 1,000 mL (0 mL IntraVENous IV Completed 05/25/18 1030)     sodium chloride 0.9 % bolus infusion 100 mL (0 mL IntraVENous IV Completed 05/25/18 1210)     iopamidol (ISOVUE-370) 76 % injection 100 mL (100 mL IntraVENous Given 05/25/18 1118)       sodium chloride (NS) flush 10 mL (10 mL IntraVENous Given 05/25/18 1118)           IMPRESSION:      1.  Dehydration         2.  Abdominal pain, LLQ (left lower quadrant)            PLAN:   1.      Current Discharge Medication List               2.      Follow-up Information               Follow up With  Specialties  Details  Why  Contact Info              Nobie Putnam [facility]  Schedule an appointment as soon as possible for a visit  Repeat labs in 1 week. Primary care referral  [address]   [place] [address]   [phone]              [facility] EMERGENCY DEP  Emergency Medicine    As needed, If symptoms worsen  [address]   [place] [address]   [phone]             3.       Return to ED for new or worsening symptoms          Marvia Pickles, NP  Please note that this dictation was completed with Dragon, the computer voice recognition software.  Quite  often unanticipated grammatical, syntax, homophones, and other interpretive  errors are inadvertently transcribed by the computer software.  Please disregard these errors.  Please excuse any errors that have escaped final proofreading.

## 2018-05-25 NOTE — ED Notes (Signed)
Patient reports she woke up at 0500 with pain in her L lower back and LLQ.  Patient denies N/V/D at this time

## 2018-05-25 NOTE — ED Notes (Signed)
Pt given discharge instructions, patient education, and follow up information. Pt states understanding. All questions answered. Pt discharged to home in private vehicle, ambulatory. Pt A/Ox4, RA, pain controlled.

## 2018-05-25 NOTE — ED Provider Notes (Signed)
Initial Complaint: LLQ and flank pain    Started: 4:45 this morning    Endorses: LLQ pain with left flank pain. Sharp pain. 6-7/10. Never had before.   Denies: F/C, N/V, D/C, CP, SOB    Made better: rest  Made worse: walking and stand    No further complaints.     No past medical history on file.  No past surgical history on file.  Reviewed    Primary care provider: No primary care provider on file.    The history is provided by the patient. No language interpreter was used.      No past medical history on file.    No past surgical history on file.      No family history on file.    Social History     Socioeconomic History   ??? Marital status: Not on file     Spouse name: Not on file   ??? Number of children: Not on file   ??? Years of education: Not on file   ??? Highest education level: Not on file   Occupational History   ??? Not on file   Social Needs   ??? Financial resource strain: Not on file   ??? Food insecurity:     Worry: Not on file     Inability: Not on file   ??? Transportation needs:     Medical: Not on file     Non-medical: Not on file   Tobacco Use   ??? Smoking status: Not on file   Substance and Sexual Activity   ??? Alcohol use: Not on file   ??? Drug use: Not on file   ??? Sexual activity: Not on file   Lifestyle   ??? Physical activity:     Days per week: Not on file     Minutes per session: Not on file   ??? Stress: Not on file   Relationships   ??? Social connections:     Talks on phone: Not on file     Gets together: Not on file     Attends religious service: Not on file     Active member of club or organization: Not on file     Attends meetings of clubs or organizations: Not on file     Relationship status: Not on file   ??? Intimate partner violence:     Fear of current or ex partner: Not on file     Emotionally abused: Not on file     Physically abused: Not on file     Forced sexual activity: Not on file   Other Topics Concern   ??? Not on file   Social History Narrative   ??? Not on file      ALLERGIES: Patient has no allergy information on record.    Review of Systems   Constitutional: Negative for chills, diaphoresis and fever.   Respiratory: Negative for shortness of breath.    Cardiovascular: Negative for chest pain.   Gastrointestinal: Positive for abdominal pain. Negative for constipation, nausea and vomiting.   Genitourinary: Negative for dysuria.   Psychiatric/Behavioral: Negative.    All other systems reviewed and are negative.    Vitals:    05/25/18 0805 05/25/18 0823 05/25/18 0825   BP:   97/68   Pulse: 94  (!) 102   Resp:   16   Temp:   98.6 ??F (37 ??C)   SpO2: 100% 100% 100%  Physical Exam   Constitutional: She is oriented to person, place, and time. She appears well-developed and well-nourished.   HENT:   Head: Normocephalic and atraumatic.   Neck: Normal range of motion. Neck supple.   Cardiovascular: Normal rate, regular rhythm, normal heart sounds and intact distal pulses.   Pulmonary/Chest: Effort normal and breath sounds normal. No respiratory distress. She has no wheezes. She has no rales. She exhibits no tenderness.   Abdominal: Soft. Normal appearance and bowel sounds are normal. There is tenderness in the left lower quadrant. There is no rigidity, no rebound, no guarding and no CVA tenderness.       Musculoskeletal: Normal range of motion.   Neurological: She is alert and oriented to person, place, and time.   Skin: Skin is warm and dry. No erythema.   Psychiatric: She has a normal mood and affect. Her behavior is normal. Judgment and thought content normal.   Nursing note and vitals reviewed.       MDM       Procedures      Assessment & Plan:     Orders Placed This Encounter   ??? URINE CULTURE HOLD SAMPLE   ??? [place] ABD LTD   ??? URINALYSIS W/MICROSCOPIC   ??? CBC WITH AUTOMATED DIFF   ??? LIPASE   ??? METABOLIC PANEL, COMPREHENSIVE   ??? POC URINE PREGNANCY TEST   ??? montelukast (SINGULAIR) 10 mg tablet   ??? levocetirizine (XYZAL) 5 mg tablet    ??? fluticasone propionate (FLONASE) 50 mcg/actuation nasal spray   ??? drospirenone-ethinyl estradiol (YAZ) 3-0.02 mg tab   ??? tretinoin (RETIN-A) 0.01 % topical gel       Discussed with Lovey Newcomer, MD,ED Provider    Marvia Pickles, NP  05/25/18  8:29 AM    Elevated white count of 19.  Left lower quadrant ultrasound of the pelvis without findings.  Urine not concerning.  Will obtain contrasted CT of the abdomen.    Marvia Pickles, NP  05/25/18  10:40 AM    Labs and imaging without acute findings. Follow-up with PCP. Follow-up with PCP in one week for repeat labs.  Discussed return precautions.     12:14 PM  Patient re-evaluated.  All questions answered.  Patient appropriate for discharge.  Given return precautions and follow up instructions.     LABORATORY TESTS:  Labs Reviewed   URINALYSIS W/MICROSCOPIC - Abnormal; Notable for the following components:       Result Value    Appearance CLOUDY (*)     Ketone TRACE (*)     Blood TRACE (*)     Leukocyte Esterase SMALL (*)     Epithelial cells MANY (*)     All other components within normal limits   CBC WITH AUTOMATED DIFF - Abnormal; Notable for the following components:    WBC 19.3 (*)     NEUTROPHILS 88 (*)     LYMPHOCYTES 6 (*)     ABS. NEUTROPHILS 16.7 (*)     ABS. MONOCYTES 1.2 (*)     ABS. IMM. GRANS. 0.1 (*)     All other components within normal limits   LIPASE - Abnormal; Notable for the following components:    Lipase 71 (*)     All other components within normal limits   METABOLIC PANEL, COMPREHENSIVE - Abnormal; Notable for the following components:    Creatinine 1.04 (*)     BUN/Creatinine ratio 11 (*)     Bilirubin, total  1.1 (*)     A-G Ratio 1.0 (*)     All other components within normal limits   URINE CULTURE HOLD SAMPLE   SAMPLES BEING HELD   HCG URINE, QL. - POC       IMAGING RESULTS:  Ct Abd Pelv W Cont    Result Date: 05/25/2018  INDICATION: Left lower quadrant abdominal pain. CT of the abdomen and  pelvis is performed with 5 mm collimation. Study is performed with 100 cc of nonionic Isovue 370. Sagittal and coronal reformatted images were also performed. CT dose reduction was achieved with the use of the standardized protocol tailored for this examination and automatic exposure control for dose modulation. Adaptive statistical iterative reconstruction (ASIR) was utilized. There is no prior study for direct comparison. Findings: Lung bases: The visualized lung bases are clear. Liver: The liver is normal. Adrenals: Adrenal glands are normal. Pancreas: The pancreas is normal. Gallbladder: The gallbladder is normal. Kidneys: The kidneys are normal. Spleen: The spleen is normal. Lymph nodes. There is no porta hepatitis, mesenteric, retroperitoneal or pelvic lymphadenopathy. Bowel: No thickened or dilated loop of large or small bowel is visualized. Appendix: The appendix is normal. Urinary bladder: Urinary bladder is partially filled and grossly normal. Miscellaneous: There is no free intraperitoneal gas. There is trace free fluid in the pelvis. There is no focal fluid collection to suggest abscess.     IMPRESSION: Trace free fluid in the pelvis.    US Transvaginal    Result Date: 05/25/2018  INDICATION: LLQ pain Exam: Transabdominal and transvaginal ultrasound of the pelvis. Transvaginal ultrasound is performed to better evaluate the adnexa and endometrium. Transabdominal: The uterus measures 5.9 cm x 2.7 cm x 3.0 cm. Echotexture of the uterus is normal. Endometrial stripe is within normal limits for premenopausal female, measuring 2 mm. Ovaries are normal in size and echotexture. Right ovary measures 1.6 cm x 0.7 cm x 1.5 cm. Left ovary measures 2.6 cm x 2.6 cm x 1.1 cm. No adnexal mass is visualized. There is no free fluid in the pelvis. Transvaginal: The uterus measures 6.1 cm x 2.4 cm x 3.9 cm. Echotexture of the uterus is normal. Endometrial stripe is within normal limits for premenopausal female,  measuring 8 mm. Ovaries are normal in size and echotexture. Right ovary measures 1.4 cm x 3.1 cm x 1.4 cm. Left ovary measures 2.6 cm x 1.7 cm x 2.7 cm. There are multiple bilateral ovarian follicles. No adnexal mass is visualized. There is trace free fluid in the pelvis.     IMPRESSION: Trace free fluid in the pelvis.     [place] Pelv Non Obs    Result Date: 05/25/2018  INDICATION: LLQ pain Exam: Transabdominal and transvaginal ultrasound of the pelvis. Transvaginal ultrasound is performed to better evaluate the adnexa and endometrium. Transabdominal: The uterus measures 5.9 cm x 2.7 cm x 3.0 cm. Echotexture of the uterus is normal. Endometrial stripe is within normal limits for premenopausal female, measuring 2 mm. Ovaries are normal in size and echotexture. Right ovary measures 1.6 cm x 0.7 cm x 1.5 cm. Left ovary measures 2.6 cm x 2.6 cm x 1.1 cm. No adnexal mass is visualized. There is no free fluid in the pelvis. Transvaginal: The uterus measures 6.1 cm x 2.4 cm x 3.9 cm. Echotexture of the uterus is normal. Endometrial stripe is within normal limits for premenopausal female, measuring 8 mm. Ovaries are normal in size and echotexture. Right ovary measures 1.4 cm x 3.1 cm x 1.4  cm. Left ovary measures 2.6 cm x 1.7 cm x 2.7 cm. There are multiple bilateral ovarian follicles. No adnexal mass is visualized. There is trace free fluid in the pelvis.     IMPRESSION: Trace free fluid in the pelvis.       MEDICATIONS GIVEN:  Medications   sodium chloride 0.9 % bolus infusion 1,000 mL (0 mL IntraVENous IV Completed 05/25/18 1030)   sodium chloride 0.9 % bolus infusion 100 mL (0 mL IntraVENous IV Completed 05/25/18 1210)   iopamidol (ISOVUE-370) 76 % injection 100 mL (100 mL IntraVENous Given 05/25/18 1118)   sodium chloride (NS) flush 10 mL (10 mL IntraVENous Given 05/25/18 1118)       IMPRESSION:  1. Dehydration    2. Abdominal pain, LLQ (left lower quadrant)        PLAN:  1.   Current Discharge Medication List        2.    Follow-up Information     Follow up With Specialties Details Why Contact Info    Nobie Putnam [facility] Schedule an appointment as soon as possible for a visit Repeat labs in 1 week. Primary care referral [address]  [place] [address]  [phone]    [facility] EMERGENCY DEP Emergency Medicine  As needed, If symptoms worsen [address]  [place] [address]  [phone]        3.     Return to ED for new or worsening symptoms       Marvia Pickles, NP            Please note that this dictation was completed with Dragon, the computer voice recognition software.  Quite often unanticipated grammatical, syntax, homophones, and other interpretive errors are inadvertently transcribed by the computer software.  Please disregard these errors.  Please excuse any errors that have escaped final proofreading.

## 2018-05-25 NOTE — ED Notes (Signed)
Pt states that she is having abd pain but does not want anything to eat.

## 2018-05-25 NOTE — ED Triage Notes (Signed)
Patient reports she woke up at 0500 with pain in her L lower back and LLQ.  Patient denies N/V/D at this time

## 2018-08-20 ENCOUNTER — Other Ambulatory Visit: Payer: Self-pay | Admitting: Advanced Practice Midwife

## 2018-08-20 DIAGNOSIS — Z3041 Encounter for surveillance of contraceptive pills: Secondary | ICD-10-CM

## 2018-08-23 NOTE — Telephone Encounter (Signed)
Please advise
